# Patient Record
Sex: Male | Born: 1995 | Hispanic: No | Marital: Single | Smoking: Never smoker
Health system: Southern US, Community
[De-identification: ages and names within clinical notes are randomized; demographics above are authoritative.]

## PROBLEM LIST (undated history)

## (undated) DIAGNOSIS — R197 Diarrhea, unspecified: Secondary | ICD-10-CM

## (undated) HISTORY — DX: Diarrhea, unspecified: R19.7

## (undated) HISTORY — PX: TYMPANOSTOMY TUBE PLACEMENT: SHX32

---
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
## 2020-01-24 ENCOUNTER — Ambulatory Visit: Payer: BLUE CROSS/BLUE SHIELD | Attending: Internal Medicine

## 2020-01-24 DIAGNOSIS — Z20822 Contact with and (suspected) exposure to covid-19: Secondary | ICD-10-CM | POA: Insufficient documentation

## 2020-01-25 LAB — NOVEL CORONAVIRUS, NAA: SARS-CoV-2, NAA: NOT DETECTED

## 2020-02-23 ENCOUNTER — Ambulatory Visit: Payer: BLUE CROSS/BLUE SHIELD | Attending: Internal Medicine

## 2020-02-23 DIAGNOSIS — Z20822 Contact with and (suspected) exposure to covid-19: Secondary | ICD-10-CM

## 2020-02-24 LAB — NOVEL CORONAVIRUS, NAA: SARS-CoV-2, NAA: NOT DETECTED

## 2020-03-16 ENCOUNTER — Ambulatory Visit: Payer: BLUE CROSS/BLUE SHIELD | Attending: Internal Medicine

## 2020-03-16 DIAGNOSIS — Z23 Encounter for immunization: Secondary | ICD-10-CM

## 2020-03-16 NOTE — Progress Notes (Signed)
   Covid-19 Vaccination Clinic  Name:  Harry Kennedy    MRN: [id number] DOB: [birth year]  03/16/2020  Mr. [name] was observed post Covid-19 immunization for 15 minutes without incident. He was provided with Vaccine Information Sheet and instruction to access the V-Safe system.   Mr. [name] was instructed to call 911 with any severe reactions post vaccine: Marland Kitchen Difficulty breathing  . Swelling of face and throat  . A fast heartbeat  . A bad rash all over body  . Dizziness and weakness   Immunizations Administered    Name Date Dose VIS Date Route   Pfizer COVID-19 Vaccine 03/16/2020  2:11 PM 0.3 mL 12/02/2019 Intramuscular   Manufacturer: [employer], [employer]   Lot: [id number]   NDC: [address]-1

## 2020-04-10 ENCOUNTER — Ambulatory Visit: Payer: BLUE CROSS/BLUE SHIELD | Attending: Internal Medicine

## 2020-04-10 DIAGNOSIS — Z23 Encounter for immunization: Secondary | ICD-10-CM

## 2020-04-10 NOTE — Progress Notes (Signed)
   Covid-19 Vaccination Clinic  Name:  DARIUSZ BRASE    MRN: [id number] DOB: March 26, 1996  04/10/2020  Mr. La was observed post Covid-19 immunization for 15 minutes without incident. He was provided with Vaccine Information Sheet and instruction to access the V-Safe system.   Mr. Yarden was instructed to call 911 with any severe reactions post vaccine: Marland Kitchen Difficulty breathing  . Swelling of face and throat  . A fast heartbeat  . A bad rash all over body  . Dizziness and weakness   Immunizations Administered    Name Date Dose VIS Date Route   Pfizer COVID-19 Vaccine 04/10/2020  2:55 PM 0.3 mL 02/15/2019 Intramuscular   Manufacturer: [employer], [employer]   Lot: [id number]   NDC: [address]-0
